# Patient Record
Sex: Female | Born: 1970 | Race: Black or African American | Hispanic: No | Marital: Married | Smoking: Current every day smoker
Health system: Southern US, Community
[De-identification: ages and names within clinical notes are randomized; demographics above are authoritative.]

## PROBLEM LIST (undated history)

## (undated) HISTORY — PX: FRACTURE SURGERY: SHX138

---

## 2007-02-11 ENCOUNTER — Emergency Department: Payer: Self-pay | Admitting: Emergency Medicine

## 2010-10-15 ENCOUNTER — Emergency Department: Payer: Self-pay | Admitting: Emergency Medicine

## 2011-11-20 ENCOUNTER — Ambulatory Visit: Payer: Self-pay | Admitting: Family Medicine

## 2011-12-27 ENCOUNTER — Emergency Department: Payer: Self-pay | Admitting: Internal Medicine

## 2011-12-27 LAB — URINALYSIS, COMPLETE
Glucose,UR: NEGATIVE mg/dL (ref 0–75)
Ketone: NEGATIVE
Nitrite: NEGATIVE
WBC UR: 82 /HPF (ref 0–5)

## 2011-12-27 LAB — COMPREHENSIVE METABOLIC PANEL
Anion Gap: 11 (ref 7–16)
Calcium, Total: 8.3 mg/dL — ABNORMAL LOW (ref 8.5–10.1)
Chloride: 109 mmol/L — ABNORMAL HIGH (ref 98–107)
Co2: 23 mmol/L (ref 21–32)
Creatinine: 0.7 mg/dL (ref 0.60–1.30)
EGFR (African American): >60
EGFR (Non-African Amer.): >60
Glucose: 91 mg/dL (ref 65–99)
Osmolality: 283 (ref 275–301)
Potassium: 3.8 mmol/L (ref 3.5–5.1)
SGOT(AST): 17 U/L (ref 15–37)
SGPT (ALT): 18 U/L

## 2011-12-27 LAB — LIPASE, BLOOD: Lipase: 135 U/L (ref 73–393)

## 2011-12-27 LAB — CBC
HGB: 12.8 g/dL (ref 12.0–16.0)
Platelet: 278 10*3/uL (ref 150–440)
RBC: 4.26 10*6/uL (ref 3.80–5.20)
RDW: 12.7 % (ref 11.5–14.5)

## 2011-12-27 LAB — PREGNANCY, URINE: Pregnancy Test, Urine: NEGATIVE m[IU]/mL

## 2012-01-18 ENCOUNTER — Ambulatory Visit: Payer: Self-pay | Admitting: Family Medicine

## 2012-02-22 ENCOUNTER — Ambulatory Visit: Payer: Self-pay | Admitting: Urology

## 2012-06-03 ENCOUNTER — Ambulatory Visit: Payer: Self-pay | Admitting: Orthopaedic Surgery

## 2012-06-03 ENCOUNTER — Inpatient Hospital Stay: Payer: Self-pay | Admitting: Specialist

## 2012-06-04 LAB — BASIC METABOLIC PANEL
Anion Gap: 7 (ref 7–16)
BUN: 8 mg/dL (ref 7–18)
Calcium, Total: 8.7 mg/dL (ref 8.5–10.1)
Osmolality: 279 (ref 275–301)
Sodium: 140 mmol/L (ref 136–145)

## 2012-06-04 LAB — PREGNANCY, URINE: Pregnancy Test, Urine: NEGATIVE m[IU]/mL

## 2012-06-04 LAB — CBC WITH DIFFERENTIAL/PLATELET
Eosinophil #: 0 10*3/uL (ref 0.0–0.7)
Eosinophil %: 0.1 %
HGB: 11.5 g/dL — ABNORMAL LOW (ref 12.0–16.0)
Lymphocyte #: 2.2 10*3/uL (ref 1.0–3.6)
MCH: 30.3 pg (ref 26.0–34.0)
MCV: 92 fL (ref 80–100)
Monocyte #: 0.6 x10 3/mm (ref 0.2–0.9)
Monocyte %: 4.7 %
Neutrophil %: 76.5 %
Platelet: 296 10*3/uL (ref 150–440)

## 2012-10-04 ENCOUNTER — Ambulatory Visit: Payer: Self-pay | Admitting: Family Medicine

## 2013-03-02 ENCOUNTER — Emergency Department: Payer: Self-pay | Admitting: Emergency Medicine

## 2015-04-07 NOTE — H&P (Signed)
    Subjective/Chief Complaint Right ankle and Right wrist pain after fall    History of Present Illness skating today and she experienced a fall.  immediate pain and deformity of R ankle and pain in R wrist.  no blow to the head and no LOC.  presented to ED for evaluation and ortho consulted for injuries listed above.    Past History urethral diverticulosis   Past Med/Surgical Hx:  Diverticulitis:   c-section x 4:   ALLERGIES:  No Known Allergies:   Family and Social History:   Family History Non-Contributory    Social History positive  tobacco, negative ETOH    + Tobacco Current (within 1 year)    Place of Living Home   Review of Systems:   Fever/Chills No    Cough No    Sputum No    Abdominal Pain No    Diarrhea No    Constipation No    Nausea/Vomiting No    SOB/DOE No    Chest Pain No    Dysuria No    Tolerating Diet Yes    Medications/Allergies Reviewed Medications/Allergies reviewed   Physical Exam:   GEN no acute distress    HEENT hearing intact to voice    NECK supple    RESP normal resp effort    CARD regular rate    ABD denies tenderness    LYMPH negaitve RLE    EXTR RLE with ext rotation deformity at the distal tibia, EHL/FHL intact, unable to asses TA and GS due to pain, sens intact throughout, 2+ dp; RUE tender radial aspect distal radius proximal to carpus, AIN/PIN/ulnar n intact, sens intact radial, ulnar, median dist, 2+ radial, warm and perfused    SKIN normal to palpation    NEURO follows commands    PSYCH A+O to time, place, person     Assessment/Admission Diagnosis R wrist XR - no noted displaced fx or malalignment.   R tib/fib XR - spiral fracture of distal tibia and comminuted distal fibula fx    Plan R wrist -  awaiting final radiology read  R ankle -  NWB I applied Short leg splint in the ED CT scan pending to rule out articular involvement of spiral tibial fracture discussed surgical plan with the patient and  her husband, ORIF with IM nail and plate/screws tomorrow NPO p MN pain control elevate RLE   Electronic Signatures: Otilio SaberSikes, Aurorah Schlachter V (MD)  (Signed 21-Jun-13 23:19)  Authored: CHIEF COMPLAINT and HISTORY, PAST MEDICAL/SURGIAL HISTORY, ALLERGIES, FAMILY AND SOCIAL HISTORY, REVIEW OF SYSTEMS, PHYSICAL EXAM, ASSESSMENT AND PLAN   Last Updated: 21-Jun-13 23:19 by Otilio SaberSikes, Jazmen Lindenbaum V (MD)

## 2015-04-07 NOTE — Discharge Summary (Signed)
PATIENT NAME:  Deborah PealsHOMAS, Rasa S MR#:  [id number] DATE OF BIRTH:  [birth year]  DATE OF ADMISSION:  06/03/2012 DATE OF DISCHARGE:  06/04/2012  DISCHARGE DIAGNOSIS: Comminuted right tibial shaft fracture with distal fibula fracture.   OPERATIONS/PROCEDURES PERFORMED: Intramedullary rodding right distal tibia and open reduction and internal fixation distal fibula fracture.   HOSPITAL COURSE: The patient was admitted by Dr. Crist Infanteharles Sikes and treated by him. History and physical examination is as produced by him on admission. The patient was taken to the Operating Room on 06/04/2012 where the above procedure was performed. She tolerated the procedure quite well. She was advanced up into the chair with physical therapy and ambulation nonweightbearing with a walker. She was discharged doing well to her home. She was to resume taking her previous medications at home except for naproxen and is to take aspirin 325 mg per day as prophylaxis for venous thrombosis and pulmonary embolism and also Vicodin 1 tablet every three hours as necessary for pain. She was advised to be completely nonweightbearing and is to keep her splint and dressing dry. She is to return to the office in one week to see myself, Dr. Katrinka BlazingSmith, for possible staple removal and x-ray.  ____________________________ Clare Gandyhristopher E. Merrit Friesen, MD ces:ap D: 06/23/2012 07:33:48 ET T: 06/23/2012 08:03:09 ET JOB#: [id number]  cc: Clare Gandyhristopher E. Zahara Rembert, MD, <Dictator> Clare GandyHRISTOPHER E Filip Luten MD ELECTRONICALLY SIGNED 06/24/2012 9:04

## 2015-04-07 NOTE — Op Note (Signed)
PATIENT NAME:  Deborah Lawrence, Deborah Lawrence MR#:  [id number] DATE OF BIRTH:  09-28-1971  DATE OF PROCEDURE:  06/04/2012  PREOPERATIVE DIAGNOSIS: Comminuted right distal third tibia fracture with associated posterior malleolar fragment and comminuted lateral malleolus/distal fibula fracture.   PROCEDURE: Open reduction internal fixation right distal tibia and distal fibular fractures.   SURGEON: Collier Bullock. Jasaiah Karwowski, MD  ANESTHESIA: General.   ESTIMATED BLOOD LOSS: 200 mL.  TOURNIQUET TIME: 60 minutes.   SPECIMENS: None.   DRAINS: None.   OPERATIVE FINDING: Comminuted tib-fib fracture as listed above.   IMPLANTS:  1. Synthes size 9 tibial female.  2. Synthes 4.0 cannulated screw for the posterior malleolar fragment.  3. Synthes one-third semitubular plate and screws for the distal fibula.   COMPLICATIONS: None.   INDICATIONS FOR THE PROCEDURE: The patient is a 44 year old female who on the day prior to this procedure presented to the Emergency Department after a mechanical fall at roller skating rink where she sustained a twisting injury to her right ankle. At that time patient was found to have the above-mentioned injuries and was admitted to the hospital for pain control and preoperative planning. Her fractures were reduced and placed into a well-padded splint. CT scan was obtained and demonstrated the associated posterior malleolus fragment that was nondisplaced at that time. The patient and her family were advised of the risks, benefits, and alternatives of operative versus nonoperative management and elected to proceed with operative management. They expressed understanding of the risks of surgery included but not limited to risk of bleeding, infection, damage to nerves, vessels, and other structures, need for further surgeries in the future, possible hardware failure, possible delayed union or malunion of the fracture fragments and possible deep venous thrombosis or pulmonary embolus. Informed consent  was obtained prior to the procedure.   DESCRIPTION OF PROCEDURE IN DETAIL: The patient was brought to the Operating Room, placed supine on the OR table. After general anesthesia was induced without complication the right lower extremity was then prepped and draped in a normal sterile fashion. Timeout was conducted where the patient was identified as the correct patient, allergies were reviewed and the right side was identified as the correct side and agreement was made to give preoperative antibiotics including Kefzol. Attention was first directed to placement of guidewire for 4.0 cannulated screw in the AP direction to stabilize the nondisplaced posterior malleolar fragment of the distal tibia. Guidewire was placed under fluoroscopic guidance, measured and overdrilled. An appropriate length screw was selected and placed over the guidewire. Fluoroscopic views demonstrated that this suitably maintained position of the nondisplaced posterior malleolar section. Attention was then turned to preparation for insertion of the tibial nail. Standard midline anterior knee approach and transpatellar tendon approach was used to gain access to the anterior aspect of the knee. Guidepin was placed under fluoroscopic guidance. After placement and direction were confirmed with AP and fluoroscopic views the entry reamer was used to open the tibial canal. Guidewire was then placed across the fracture and impacted into the distal physial scar in the appropriate position. Pressure was maintained in a reduced position while subsequent reamers were introduced to ream the course of the tibial nail starting with an 8.5 end-cutting reamer and progressing up to a 10.5 mm reamer. 10.5 mm reamer appeared to give good chatter in the isthmus and would give good fit and fill of her canal therefore size 9 nail was chosen to allow for ease of insertion and hoping to avoid displacement of the fracture  fragments during insertion. Size 9 Synthes  tibia EX nail was then inserted without complication. Nail was impacted and periodic fluoroscopic images were checked to assure appropriate fracture alignment during placement of the nail. Once the nail had been fully seated lateral view of the knee was taken to assure that the nail would not be prominent at the anterior knee. Once placement of the nail was complete the proximal interlocks were then introduced through the proximal tibial jig. Alignment of the fracture fragments was confirmed. The fracture was impacted slightly and the distal interlocks were inserted using perfect circle technique. Three distal interlocks were inserted due to the distal nature and comminuted nature of the overall fracture pattern, two medial to lateral screws and one anterior to posterior screw. The oblique screws at the most distal portion of the nail were not used as it was felt that the prior placed 4.0 cannulated screw may further interfere with the trajectory of the oblique screws. Attention was then directed to the distal fibula. Skin was incised sharply and blunt dissection taken down to the level of the fibula laterally. Proximal and distal aspects of the fibula were exposed. A 10-hole one-third semitubular plate was chosen as the appropriate length and distal portion of the plate was contoured to fit the distal fibula. The plate was first affixed to the distal fibula. Fibula was checked to be in the appropriate length and orientation in relation to the distal tibia and the talus. Once this had been achieved plate was affixed proximally to the intact fibula spanning the severely comminuted gap. AP and lateral fluoroscopic views demonstrated good overall fracture alignment and plate alignment on the bone therefore the remainder of the screws were drilled and filled with a mixture of 4.0 cancellus screws and 3.5 cortical screws as appropriate for the bone quality in those respective areas. All wounds were thoroughly irrigated  with normal saline and closed in layered fashion using 0 Vicryl for the peritenon at the anterior knee incision and 2-0 Vicryl for the subcutaneous layer of all other incisions and staples for the skin on all other incisions. Dry sterile dressings were applied and the patient was placed into a well padded posterior and mediolateral splint. The patient was awakened from her anesthesia and transferred to her stretcher and to the PACU in stable condition.   DISPOSITION: Patient will be nonweightbearing to the right lower extremity. She will keep it elevated to reduce swelling and pain. She will have deep vein thrombosis prophylaxis with aspirin 325 mg b.i.d. as she would be expected to be rather mobile and ambulatory in the early postoperative period despite her non-wight bearing restriction. We feel that she is low risk for deep vein thrombosis or PE given her overall situation. She will continue her prophylactic antibiotics with Kefzol. She will have early mobilization with physical therapy and occupational therapy. Patient will plan to follow up with Dr. Myra Rudehristopher Smith in clinic for further evaluation, treatment and recommendation regarding progression of weight-bearing in approximately six weeks. Patient has also been counseled extensively and discussions with her family have centered around the need for her to stop smoking in attempt to further encourage fracture healing following this injury. At the end of the case all sponge counts and instrument counts were correct.   ____________________________ Collier Bullockharles V. Davelyn Gwinn, MD [facility] D: 06/04/2012 18:06:34 ET T: 06/05/2012 10:51:12 ET JOB#: [id number]  cc: Collier Bullockharles V. Bibi Economos, MD, <Dictator> Otilio SaberHARLES V Valisa Karpel MD ELECTRONICALLY SIGNED 07/11/2012 8:36

## 2015-11-15 ENCOUNTER — Encounter: Payer: Self-pay | Admitting: Physician Assistant

## 2015-11-15 ENCOUNTER — Ambulatory Visit: Payer: Self-pay | Admitting: Physician Assistant

## 2015-11-15 VITALS — BP 120/70 | HR 84 | Temp 99.2°F

## 2015-11-15 DIAGNOSIS — B349 Viral infection, unspecified: Secondary | ICD-10-CM

## 2015-11-15 DIAGNOSIS — R197 Diarrhea, unspecified: Secondary | ICD-10-CM

## 2015-11-15 NOTE — Progress Notes (Signed)
S: c/o body aches, fever, chills, cough, diarrhea x 7 today, sx started yesterday, denies bp/sob  O: vitals with temp at 99.2, nad, ENT wnl, neck supple no lymph, lungs c t a, cv rrr, abd soft nontender bs normal all 4 quads,  Flu swab neg  A: diarrhea, viral illness  P: otc  Meds, fluids, rest, otc immodium ad, pt should not work until has not had a fever in 24-48h and has not had diarrhea in 24-48 h

## 2015-11-15 NOTE — Patient Instructions (Signed)
Food Choices to Help Relieve Diarrhea, Adult When you have diarrhea, the foods you eat and your eating habits are very important. Choosing the right foods and drinks can help relieve diarrhea. Also, because diarrhea can last up to 7 days, you need to replace lost fluids and electrolytes (such as sodium, potassium, and chloride) in order to help prevent dehydration.  WHAT GENERAL GUIDELINES DO I NEED TO FOLLOW?  Slowly drink 1 cup (8 oz) of fluid for each episode of diarrhea. If you are getting enough fluid, your urine will be clear or pale yellow.  Eat starchy foods. Some good choices include white rice, white toast, pasta, low-fiber cereal, baked potatoes (without the skin), saltine crackers, and bagels.  Avoid large servings of any cooked vegetables.  Limit fruit to two servings per day. A serving is  cup or 1 small piece.  Choose foods with less than 2 g of fiber per serving.  Limit fats to less than 8 tsp (38 g) per day.  Avoid fried foods.  Eat foods that have probiotics in them. Probiotics can be found in certain dairy products.  Avoid foods and beverages that may increase the speed at which food moves through the stomach and intestines (gastrointestinal tract). Things to avoid include:  High-fiber foods, such as dried fruit, raw fruits and vegetables, nuts, seeds, and whole grain foods.  Spicy foods and high-fat foods.  Foods and beverages sweetened with high-fructose corn syrup, honey, or sugar alcohols such as xylitol, sorbitol, and mannitol. WHAT FOODS ARE RECOMMENDED? Grains White rice. White, French, or pita breads (fresh or toasted), including plain rolls, buns, or bagels. White pasta. Saltine, soda, or graham crackers. Pretzels. Low-fiber cereal. Cooked cereals made with water (such as cornmeal, farina, or cream cereals). Plain muffins. Matzo. Melba toast. Zwieback.  Vegetables Potatoes (without the skin). Strained tomato and vegetable juices. Most well-cooked and canned  vegetables without seeds. Tender lettuce. Fruits Cooked or canned applesauce, apricots, cherries, fruit cocktail, grapefruit, peaches, pears, or plums. Fresh bananas, apples without skin, cherries, grapes, cantaloupe, grapefruit, peaches, oranges, or plums.  Meat and Other Protein Products Baked or boiled chicken. Eggs. Tofu. Fish. Seafood. Smooth peanut butter. Ground or well-cooked tender beef, ham, veal, lamb, pork, or poultry.  Dairy Plain yogurt, kefir, and unsweetened liquid yogurt. Lactose-free milk, buttermilk, or soy milk. Plain hard cheese. Beverages Sport drinks. Clear broths. Diluted fruit juices (except prune). Regular, caffeine-free sodas such as ginger ale. Water. Decaffeinated teas. Oral rehydration solutions. Sugar-free beverages not sweetened with sugar alcohols. Other Bouillon, broth, or soups made from recommended foods.  The items listed above may not be a complete list of recommended foods or beverages. Contact your dietitian for more options. WHAT FOODS ARE NOT RECOMMENDED? Grains Whole grain, whole wheat, bran, or rye breads, rolls, pastas, crackers, and cereals. Wild or brown rice. Cereals that contain more than 2 g of fiber per serving. Corn tortillas or taco shells. Cooked or dry oatmeal. Granola. Popcorn. Vegetables Raw vegetables. Cabbage, broccoli, Brussels sprouts, artichokes, baked beans, beet greens, corn, kale, legumes, peas, sweet potatoes, and yams. Potato skins. Cooked spinach and cabbage. Fruits Dried fruit, including raisins and dates. Raw fruits. Stewed or dried prunes. Fresh apples with skin, apricots, mangoes, pears, raspberries, and strawberries.  Meat and Other Protein Products Chunky peanut butter. Nuts and seeds. Beans and lentils. Bacon.  Dairy High-fat cheeses. Milk, chocolate milk, and beverages made with milk, such as milk shakes. Cream. Ice cream. Sweets and Desserts Sweet rolls, doughnuts, and sweet breads.   Pancakes and waffles. Fats and  Oils Butter. Cream sauces. Margarine. Salad oils. Plain salad dressings. Olives. Avocados.  Beverages Caffeinated beverages (such as coffee, tea, soda, or energy drinks). Alcoholic beverages. Fruit juices with pulp. Prune juice. Soft drinks sweetened with high-fructose corn syrup or sugar alcohols. Other Coconut. Hot sauce. Chili powder. Mayonnaise. Gravy. Cream-based or milk-based soups.  The items listed above may not be a complete list of foods and beverages to avoid. Contact your dietitian for more information. WHAT SHOULD I DO IF I BECOME DEHYDRATED? Diarrhea can sometimes lead to dehydration. Signs of dehydration include dark urine and dry mouth and skin. If you think you are dehydrated, you should rehydrate with an oral rehydration solution. These solutions can be purchased at pharmacies, retail stores, or online.  Drink -1 cup (120-240 mL) of oral rehydration solution each time you have an episode of diarrhea. If drinking this amount makes your diarrhea worse, try drinking smaller amounts more often. For example, drink 1-3 tsp (5-15 mL) every 5-10 minutes.  A general rule for staying hydrated is to drink 1-2 L of fluid per day. Talk to your health care provider about the specific amount you should be drinking each day. Drink enough fluids to keep your urine clear or pale yellow.   This information is not intended to replace advice given to you by your health care provider. Make sure you discuss any questions you have with your health care provider.   Document Released: 02/20/2004 Document Revised: 12/21/2014 Document Reviewed: 10/23/2013 Elsevier Interactive Patient Education 2016 Else[employer] Inc. Viral Gastroenteritis Viral gastroenteritis is also called stomach flu. This illness is caused by a certain type of germ (virus). It can cause sudden watery poop (diarrhea) and throwing up (vomiting). This can cause you to lose body fluids (dehydration). This illness usually lasts for 3 to 8 days.  It usually goes away on its own. HOME CARE   Drink enough fluids to keep your pee (urine) clear or pale yellow. Drink small amounts of fluids often.  Ask your doctor how to replace body fluid losses (rehydration).  Avoid:  Foods high in sugar.  Alcohol.  Bubbly (carbonated) drinks.  Tobacco.  Juice.  Caffeine drinks.  Very hot or cold fluids.  Fatty, greasy foods.  Eating too much at one time.  Dairy products until 24 to 48 hours after your watery poop stops.  You may eat foods with active cultures (probiotics). They can be found in some yogurts and supplements.  Wash your hands well to avoid spreading the illness.  Only take medicines as told by your doctor. Do not give aspirin to children. Do not take medicines for watery poop (antidiarrheals).  Ask your doctor if you should keep taking your regular medicines.  Keep all doctor visits as told. GET HELP RIGHT AWAY IF:   You cannot keep fluids down.  You do not pee at least once every 6 to 8 hours.  You are short of breath.  You see blood in your poop or throw up. This may look like coffee grounds.  You have belly (abdominal) pain that gets worse or is just in one small spot (localized).  You keep throwing up or having watery poop.  You have a fever.  The patient is a child younger than 3 months, and he or she has a fever.  The patient is a child older than 3 months, and he or she has a fever and problems that do not go away.  The patient is  a child older than 3 months, and he or she has a fever and problems that suddenly get worse.  The patient is a baby, and he or she has no tears when crying. MAKE SURE YOU:   Understand these instructions.  Will watch your condition.  Will get help right away if you are not doing well or get worse.   This information is not intended to replace advice given to you by your health care provider. Make sure you discuss any questions you have with your health care  provider.   Document Released: 05/18/2008 Document Revised: 02/22/2012 Document Reviewed: 09/16/2011 Elsevier Interactive Patient Education 2016 Else[employer] Inc. Viral Infections A virus is a type of germ. Viruses can cause:  Minor sore throats.  Aches and pains.  Headaches.  Runny nose.  Rashes.  Watery eyes.  Tiredness.  Coughs.  Loss of appetite.  Feeling sick to your stomach (nausea).  Throwing up (vomiting).  Watery poop (diarrhea). HOME CARE   Only take medicines as told by your doctor.  Drink enough water and fluids to keep your pee (urine) clear or pale yellow. Sports drinks are a good choice.  Get plenty of rest and eat healthy. Soups and broths with crackers or rice are fine. GET HELP RIGHT AWAY IF:   You have a very bad headache.  You have shortness of breath.  You have chest pain or neck pain.  You have an unusual rash.  You cannot stop throwing up.  You have watery poop that does not stop.  You cannot keep fluids down.  You or your child has a temperature by mouth above 102 F (38.9 C), not controlled by medicine.  Your baby is older than 3 months with a rectal temperature of 102 F (38.9 C) or higher.  Your baby is 823 months old or younger with a rectal temperature of 100.4 F (38 C) or higher. MAKE SURE YOU:   Understand these instructions.  Will watch this condition.  Will get help right away if you are not doing well or get worse.   This information is not intended to replace advice given to you by your health care provider. Make sure you discuss any questions you have with your health care provider.   Document Released: 11/12/2008 Document Revised: 02/22/2012 Document Reviewed: 05/08/2015 Elsevier Interactive Patient Education [employer]

## 2015-11-15 NOTE — Addendum Note (Signed)
Addended by: Yvonne KendallBROWN, SANDRA D on: 11/15/2015 02:35 PM   Modules accepted: Orders

## 2015-12-11 ENCOUNTER — Emergency Department
Admission: EM | Admit: 2015-12-11 | Discharge: 2015-12-11 | Disposition: A | Discharge status: Home / Self Care | Payer: PRIVATE HEALTH INSURANCE | Attending: Emergency Medicine | Admitting: Emergency Medicine

## 2015-12-11 ENCOUNTER — Encounter: Payer: Self-pay | Admitting: Emergency Medicine

## 2015-12-11 DIAGNOSIS — Y990 Civilian activity done for income or pay: Secondary | ICD-10-CM | POA: Diagnosis not present

## 2015-12-11 DIAGNOSIS — X500XXA Overexertion from strenuous movement or load, initial encounter: Secondary | ICD-10-CM | POA: Insufficient documentation

## 2015-12-11 DIAGNOSIS — S39012A Strain of muscle, fascia and tendon of lower back, initial encounter: Secondary | ICD-10-CM | POA: Insufficient documentation

## 2015-12-11 DIAGNOSIS — Y9289 Other specified places as the place of occurrence of the external cause: Secondary | ICD-10-CM | POA: Insufficient documentation

## 2015-12-11 DIAGNOSIS — S3992XA Unspecified injury of lower back, initial encounter: Secondary | ICD-10-CM | POA: Diagnosis present

## 2015-12-11 DIAGNOSIS — F17200 Nicotine dependence, unspecified, uncomplicated: Secondary | ICD-10-CM | POA: Diagnosis not present

## 2015-12-11 DIAGNOSIS — Y9389 Activity, other specified: Secondary | ICD-10-CM | POA: Insufficient documentation

## 2015-12-11 DIAGNOSIS — M5416 Radiculopathy, lumbar region: Secondary | ICD-10-CM | POA: Diagnosis not present

## 2015-12-11 DIAGNOSIS — M62830 Muscle spasm of back: Secondary | ICD-10-CM

## 2015-12-11 MED ORDER — MELOXICAM 15 MG PO TABS
15.0000 mg | ORAL_TABLET | Freq: Every day | ORAL | Status: AC
Start: 2015-12-11 — End: ?

## 2015-12-11 MED ORDER — CYCLOBENZAPRINE HCL 10 MG PO TABS: 10.0000 mg | ORAL_TABLET | Freq: Three times a day (TID) | ORAL | Status: AC | PRN

## 2015-12-11 NOTE — ED Notes (Signed)
Pt states she hurt her back at work last week. C/o low back pain.

## 2015-12-11 NOTE — Discharge Instructions (Signed)
Back Exercises The following exercises strengthen the muscles that help to support the back. They also help to keep the lower back flexible. Doing these exercises can help to prevent back pain or lessen existing pain. If you have back pain or discomfort, try doing these exercises 2-3 times each day or as told by your health care provider. When the pain goes away, do them once each day, but increase the number of times that you repeat the steps for each exercise (do more repetitions). If you do not have back pain or discomfort, do these exercises once each day or as told by your health care provider. EXERCISES Single Knee to Chest Repeat these steps 3-5 times for each leg:  Lie on your back on a firm bed or the floor with your legs extended.  Bring one knee to your chest. Your other leg should stay extended and in contact with the floor.  Hold your knee in place by grabbing your knee or thigh.  Pull on your knee until you feel a gentle stretch in your lower back.  Hold the stretch for 10-30 seconds.  Slowly release and straighten your leg. Pelvic Tilt Repeat these steps 5-10 times:  Lie on your back on a firm bed or the floor with your legs extended.  Bend your knees so they are pointing toward the ceiling and your feet are flat on the floor.  Tighten your lower abdominal muscles to press your lower back against the floor. This motion will tilt your pelvis so your tailbone points up toward the ceiling instead of pointing to your feet or the floor.  With gentle tension and even breathing, hold this position for 5-10 seconds. Cat-Cow Repeat these steps until your lower back becomes more flexible:  Get into a hands-and-knees position on a firm surface. Keep your hands under your shoulders, and keep your knees under your hips. You may place padding under your knees for comfort.  Let your head hang down, and point your tailbone toward the floor so your lower back becomes rounded like the  back of a cat.  Hold this position for 5 seconds.  Slowly lift your head and point your tailbone up toward the ceiling so your back forms a sagging arch like the back of a cow.  Hold this position for 5 seconds. Press-Ups Repeat these steps 5-10 times:  Lie on your abdomen (face-down) on the floor.  Place your palms near your head, about shoulder-width apart.  While you keep your back as relaxed as possible and keep your hips on the floor, slowly straighten your arms to raise the top half of your body and lift your shoulders. Do not use your back muscles to raise your upper torso. You may adjust the placement of your hands to make yourself more comfortable.  Hold this position for 5 seconds while you keep your back relaxed.  Slowly return to lying flat on the floor. Bridges Repeat these steps 10 times:  Lie on your back on a firm surface.  Bend your knees so they are pointing toward the ceiling and your feet are flat on the floor.  Tighten your buttocks muscles and lift your buttocks off of the floor until your waist is at almost the same height as your knees. You should feel the muscles working in your buttocks and the back of your thighs. If you do not feel these muscles, slide your feet 1-2 inches farther away from your buttocks.  Hold this position for 3-5  seconds.  Slowly lower your hips to the starting position, and allow your buttocks muscles to relax completely. If this exercise is too easy, try doing it with your arms crossed over your chest. Abdominal Crunches Repeat these steps 5-10 times:  Lie on your back on a firm bed or the floor with your legs extended.  Bend your knees so they are pointing toward the ceiling and your feet are flat on the floor.  Cross your arms over your chest.  Tip your chin slightly toward your chest without bending your neck.  Tighten your abdominal muscles and slowly raise your trunk (torso) high enough to lift your shoulder blades a  tiny bit off of the floor. Avoid raising your torso higher than that, because it can put too much stress on your low back and it does not help to strengthen your abdominal muscles.  Slowly return to your starting position. Back Lifts Repeat these steps 5-10 times: 1. Lie on your abdomen (face-down) with your arms at your sides, and rest your forehead on the floor. 2. Tighten the muscles in your legs and your buttocks. 3. Slowly lift your chest off of the floor while you keep your hips pressed to the floor. Keep the back of your head in line with the curve in your back. Your eyes should be looking at the floor. 4. Hold this position for 3-5 seconds. 5. Slowly return to your starting position. SEEK MEDICAL CARE IF:  Your back pain or discomfort gets much worse when you do an exercise.  Your back pain or discomfort does not lessen within 2 hours after you exercise. If you have any of these problems, stop doing these exercises right away. Do not do them again unless your health care provider says that you can. SEEK IMMEDIATE MEDICAL CARE IF:  You develop sudden, severe back pain. If this happens, stop doing the exercises right away. Do not do them again unless your health care provider says that you can.   This information is not intended to replace advice given to you by your health care provider. Make sure you discuss any questions you have with your health care provider.   Document Released: 01/07/2005 Document Revised: 08/21/2015 Document Reviewed: 01/24/2015 Elsevier Interactive Patient Education 2016 [place] Injury Prevention Back injuries can be very painful. They can also be difficult to heal. After having one back injury, you are more likely to injure your back again. It is important to learn how to avoid injuring or re-injuring your back. The following tips can help you to prevent a back injury. WHAT SHOULD I KNOW ABOUT PHYSICAL FITNESS?  Exercise for 30 minutes per  day on most days of the week or as directed by your health care provider. Make sure to:  Do aerobic exercises, such as walking, jogging, biking, or swimming.  Do exercises that increase balance and strength, such as tai chi and yoga. These can decrease your risk of falling and injuring your back.  Do stretching exercises to help with flexibility.  Try to develop strong abdominal muscles. Your abdominal muscles provide a lot of the support that is needed by your back.  Maintain a healthy weight. This helps to decrease your risk of a back injury. WHAT SHOULD I KNOW ABOUT MY DIET?  Talk with your health care provider about your overall diet. Take supplements and vitamins only as directed by your health care provider.  Talk with your health care provider about how much calcium and  vitamin D you need each day. These nutrients help to prevent weakening of the bones (osteoporosis). Osteoporosis can cause broken (fractured) bones, which lead to back pain. °· Include good sources of calcium in your diet, such as dairy products, green leafy vegetables, and products that have had calcium added to them (fortified). °· Include good sources of vitamin D in your diet, such as milk and foods that are fortified with vitamin D. °WHAT SHOULD I KNOW ABOUT MY POSTURE? °· Sit up straight and stand up straight. Avoid leaning forward when you sit or hunching over when you stand. °· Choose chairs that have good low-back (lumbar) support. °· If you work at a desk, sit close to it so you do not need to lean over. Keep your chin tucked in. Keep your neck drawn back, and keep your elbows bent at a right angle. Your arms should look like the letter "L." °· Sit high and close to the steering wheel when you drive. Add a lumbar support to your car seat, if needed. °· Avoid sitting or standing in one position for very long. Take breaks to get up, stretch, and walk around at least one time every hour. Take breaks every hour if you are  driving for long periods of time. °· Sleep on your side with your knees slightly bent, or sleep on your back with a pillow under your knees. Do not lie on the front of your body to sleep. °WHAT SHOULD I KNOW ABOUT LIFTING, TWISTING, AND REACHING? °Lifting and Heavy Lifting °· Avoid heavy lifting, especially repetitive heavy lifting. If you must do heavy lifting: °· Stretch before lifting. °· Work slowly. °· Rest between lifts. °· Use a tool such as a cart or a dolly to move objects if one is available. °· Make several small trips instead of carrying one heavy load. °· Ask for help when you need it, especially when moving big objects. °· Follow these steps when lifting: °· Stand with your feet shoulder-width apart. °· Get as close to the object as you can. Do not try to pick up a heavy object that is far from your body. °· Use handles or lifting straps if they are available. °· Bend at your knees. Squat down, but keep your heels off the floor. °· Keep your shoulders pulled back, your chin tucked in, and your back straight. °· Lift the object slowly while you tighten the muscles in your legs, abdomen, and buttocks. Keep the object as close to the center of your body as possible. °· Follow these steps when putting down a heavy load: °· Stand with your feet shoulder-width apart. °· Lower the object slowly while you tighten the muscles in your legs, abdomen, and buttocks. Keep the object as close to the center of your body as possible. °· Keep your shoulders pulled back, your chin tucked in, and your back straight. °· Bend at your knees. Squat down, but keep your heels off the floor. °· Use handles or lifting straps if they are available. °Twisting and Reaching °· Avoid lifting heavy objects above your waist. °· Do not twist at your waist while you are lifting or carrying a load. If you need to turn, move your feet. °· Do not bend over without bending at your knees. °· Avoid reaching over your head, across a table, or  for an object on a high surface. °WHAT ARE SOME OTHER TIPS? °· Avoid wet floors and icy ground. Keep sidewalks clear of ice to prevent   falls.  Do not sleep on a mattress that is too soft or too hard.  Keep items that are used frequently within easy reach.  Put heavier objects on shelves at waist level, and put lighter objects on lower or higher shelves.  Find ways to decrease your stress, such as exercise, massage, or relaxation techniques. Stress can build up in your muscles. Tense muscles are more vulnerable to injury.  Talk with your health care provider if you feel anxious or depressed. These conditions can make back pain worse.  Wear flat heel shoes with cushioned soles.  Avoid sudden movements.  Use both shoulder straps when carrying a backpack.  Do not use any tobacco products, including cigarettes, chewing tobacco, or electronic cigarettes. If you need help quitting, ask your health care provider.   This information is not intended to replace advice given to you by your health care provider. Make sure you discuss any questions you have with your health care provider.   Document Released: 01/07/2005 Document Revised: 04/16/2015 Document Reviewed: 12/04/2014 Elsevier Interactive Patient Education 2016 [place] therapy can help ease sore, stiff, injured, and tight muscles and joints. Heat relaxes your muscles, which may help ease your pain.  RISKS AND COMPLICATIONS If you have any of the following conditions, do not use heat therapy unless your health care provider has approved:  Poor circulation.  Healing wounds or scarred skin in the area being treated.  Diabetes, heart disease, or high blood pressure.  Not being able to feel (numbness) the area being treated.  Unusual swelling of the area being treated.  Active infections.  Blood clots.  Cancer.  Inability to communicate pain. This may include young children and people who have problems  with their brain function (dementia).  Pregnancy. Heat therapy should only be used on old, pre-existing, or long-lasting (chronic) injuries. Do not use heat therapy on new injuries unless directed by your health care provider. HOW TO USE HEAT THERAPY There are several different kinds of heat therapy, including:  Moist heat pack.  Warm water bath.  Hot water bottle.  Electric heating pad.  Heated gel pack.  Heated wrap.  Electric heating pad. Use the heat therapy method suggested by your health care provider. Follow your health care provider's instructions on when and how to use heat therapy. GENERAL HEAT THERAPY RECOMMENDATIONS  Do not sleep while using heat therapy. Only use heat therapy while you are awake.  Your skin may turn pink while using heat therapy. Do not use heat therapy if your skin turns red.  Do not use heat therapy if you have new pain.  High heat or long exposure to heat can cause burns. Be careful when using heat therapy to avoid burning your skin.  Do not use heat therapy on areas of your skin that are already irritated, such as with a rash or sunburn. SEEK MEDICAL CARE IF:  You have blisters, redness, swelling, or numbness.  You have new pain.  Your pain is worse. MAKE SURE YOU:  Understand these instructions.  Will watch your condition.  Will get help right away if you are not doing well or get worse.   This information is not intended to replace advice given to you by your health care provider. Make sure you discuss any questions you have with your health care provider.   Document Released: 02/22/2012 Document Revised: 12/21/2014 Document Reviewed: 01/23/2014 Elsevier Interactive Patient Education 2016 [employer]  Lumbosacral Strain Lumbosacral strain is  a strain of any of the parts that make up your lumbosacral vertebrae. Your lumbosacral vertebrae are the bones that make up the lower third of your backbone. Your lumbosacral vertebrae  are held together by muscles and tough, fibrous tissue (ligaments).  CAUSES  A sudden blow to your back can cause lumbosacral strain. Also, anything that causes an excessive stretch of the muscles in the low back can cause this strain. This is typically seen when people exert themselves strenuously, fall, lift heavy objects, bend, or crouch repeatedly. RISK FACTORS  Physically demanding work.  Participation in pushing or pulling sports or sports that require a sudden twist of the back (tennis, golf, baseball).  Weight lifting.  Excessive lower back curvature.  Forward-tilted pelvis.  Weak back or abdominal muscles or both.  Tight hamstrings. SIGNS AND SYMPTOMS  Lumbosacral strain may cause pain in the area of your injury or pain that moves (radiates) down your leg.  DIAGNOSIS Your health care provider can often diagnose lumbosacral strain through a physical exam. In some cases, you may need tests such as X-ray exams.  TREATMENT  Treatment for your lower back injury depends on many factors that your clinician will have to evaluate. However, most treatment will include the use of anti-inflammatory medicines. HOME CARE INSTRUCTIONS   Avoid hard physical activities (tennis, racquetball, waterskiing) if you are not in proper physical condition for it. This may aggravate or create problems.  If you have a back problem, avoid sports requiring sudden body movements. Swimming and walking are generally safer activities.  Maintain good posture.  Maintain a healthy weight.  For acute conditions, you may put ice on the injured area.  Put ice in a plastic bag.  Place a towel between your skin and the bag.  Leave the ice on for 20 minutes, 2-3 times a day.  When the low back starts healing, stretching and strengthening exercises may be recommended. SEEK MEDICAL CARE IF:  Your back pain is getting worse.  You experience severe back pain not relieved with medicines. SEEK IMMEDIATE  MEDICAL CARE IF:   You have numbness, tingling, weakness, or problems with the use of your arms or legs.  There is a change in bowel or bladder control.  You have increasing pain in any area of the body, including your belly (abdomen).  You notice shortness of breath, dizziness, or feel faint.  You feel sick to your stomach (nauseous), are throwing up (vomiting), or become sweaty.  You notice discoloration of your toes or legs, or your feet get very cold. MAKE SURE YOU:   Understand these instructions.  Will watch your condition.  Will get help right away if you are not doing well or get worse.   This information is not intended to replace advice given to you by your health care provider. Make sure you discuss any questions you have with your health care provider.   Document Released: 09/09/2005 Document Revised: 12/21/2014 Document Reviewed: 07/19/2013 Elsevier Interactive Patient Education [employer].

## 2015-12-11 NOTE — ED Provider Notes (Signed)
[facility] Emergency Department Provider Note  ____________________________________________  Time seen: Approximately 4:42 PM  I have reviewed the triage vital signs and the nursing notes.   HISTORY  Chief Complaint Back Pain    HPI Deborah Lawrence is a 44 y.o. female who presents emergency department complaining of lateral lower back pain and left-sided midback pain. She states that she was at work a week ago when she lifted a tray of food in a awkward position and felt a "twinge"in her back. She initially did not think anything of this but states that the pain has increased. Pain started in the left side and has increased to include right-sided lower back pain and left-sided midback pain. She now endorses some mild burning sensations and lateral left leg. Patient denies any saddle anesthesia or bowel or bladder dysfunction. Patient states pain is constant, moderate, and a burning/tight sensation.   History reviewed. No pertinent past medical history.  There are no active problems to display for this patient.   History reviewed. No pertinent past surgical history.  Current Outpatient Rx  Name  Route  Sig  Dispense  Refill  . cyclobenzaprine (FLEXERIL) 10 MG tablet   Oral   Take 1 tablet (10 mg total) by mouth 3 (three) times daily as needed for muscle spasms.   15 tablet   0   . meloxicam (MOBIC) 15 MG tablet   Oral   Take 1 tablet (15 mg total) by mouth daily.   30 tablet   0     Allergies Review of patient's allergies indicates no known allergies.  No family history on file.  Social History Social History  Substance Use Topics  . Smoking status: Current Every Day Smoker  . Smokeless tobacco: None  . Alcohol Use: None    Review of Systems Constitutional: No fever/chills Eyes: No visual changes. ENT: No sore throat. Cardiovascular: Denies chest pain. Respiratory: Denies shortness of breath. Gastrointestinal: No abdominal pain.   No nausea, no vomiting.  No diarrhea.  No constipation. Genitourinary: Negative for dysuria. Musculoskeletal: Endorses back pain. Skin: Negative for rash. Neurological: Negative for headaches, focal weakness or numbness.  10-point ROS otherwise negative.  ____________________________________________   PHYSICAL EXAM:  VITAL SIGNS: ED Triage Vitals  Enc Vitals Group     BP 12/11/15 1511 140/74 mmHg     Pulse Rate 12/11/15 1511 82     Resp 12/11/15 1511 18     Temp 12/11/15 1511 98.6 F (37 C)     Temp Source 12/11/15 1511 Oral     SpO2 12/11/15 1511 99 %     Weight 12/11/15 1636 175 lb (79.379 kg)     Height 12/11/15 1636 5\' 4"  (1.626 m)     Head Cir --      Peak Flow --      Pain Score 12/11/15 1514 8     Pain Loc --      Pain Edu? --      Excl. in GC? --     Constitutional: Alert and oriented. Well appearing and in no acute distress. Eyes: Conjunctivae are normal. PERRL. EOMI. Head: Atraumatic. Nose: No congestion/rhinnorhea. Mouth/Throat: Mucous membranes are moist.  Oropharynx non-erythematous. Neck: No stridor.  No cervical spine tenderness to palpation. Cardiovascular: Normal rate, regular rhythm. Grossly normal heart sounds.  Good peripheral circulation. Respiratory: Normal respiratory effort.  No retractions. Lungs CTAB. Gastrointestinal: Soft and nontender. No distention. No abdominal bruits. No CVA tenderness. Musculoskeletal: No lower extremity tenderness  nor edema.  No joint effusions. No visible deformity to spine upon inspection. There is no tenderness to palpation midline spinal processes. Diffuse tenderness to palpation over the thoracic and lumbar paraspinal muscle groups left side and diffuse tenderness to palpation over the paraspinal muscle groups in the lumbar region right side. Spasms are noted in these 3 regions. Negative straight leg raise bilaterally. Pulses and sensation are intact distally. Neurologic:  Normal speech and language. No gross focal  neurologic deficits are appreciated. No gait instability. Skin:  Skin is warm, dry and intact. No rash noted. Psychiatric: Mood and affect are normal. Speech and behavior are normal.  ____________________________________________   LABS (all labs ordered are listed, but only abnormal results are displayed)  Labs Reviewed - No data to display ____________________________________________  EKG   ____________________________________________  RADIOLOGY   ____________________________________________   PROCEDURES  Procedure(s) performed: None  Critical Care performed: No  ____________________________________________   INITIAL IMPRESSION / ASSESSMENT AND PLAN / ED COURSE  Pertinent labs & imaging results that were available during my care of the patient were reviewed by me and considered in my medical decision making (see chart for details).  Patient's diagnosis is consistent with muscle strain to the lumbar region with paraspinal muscle spasms. Patient does have some mild radicular symptoms and left leg. Patient will be placed on muscle relaxers and anti-inflammatories for symptom control. She is to follow-up with orthopedics or primary care for any symptoms persist past history course. Patient verbalizes understanding of her diagnosis and treatment plan and verbalizes compliance with same.   New Prescriptions   CYCLOBENZAPRINE (FLEXERIL) 10 MG TABLET    Take 1 tablet (10 mg total) by mouth 3 (three) times daily as needed for muscle spasms.   MELOXICAM (MOBIC) 15 MG TABLET    Take 1 tablet (15 mg total) by mouth daily.    ____________________________________________   FINAL CLINICAL IMPRESSION(S) / ED DIAGNOSES  Final diagnoses:  Strain of lumbar paraspinal muscle, initial encounter  Paraspinal muscle spasm  Lumbar radiculopathy      Racheal Patches, PA-C 12/11/15 1707  Loleta Rose, MD 12/12/15 1610

## 2015-12-11 NOTE — ED Notes (Signed)
Conts to have lower back pain after injury to back about 1 week ago   Ambulates well to treatment room

## 2016-01-03 ENCOUNTER — Ambulatory Visit: Payer: PRIVATE HEALTH INSURANCE | Attending: Family | Admitting: Physical Therapy

## 2016-01-03 ENCOUNTER — Encounter: Payer: Self-pay | Admitting: Physical Therapy

## 2016-01-03 DIAGNOSIS — M2560 Stiffness of unspecified joint, not elsewhere classified: Secondary | ICD-10-CM | POA: Diagnosis present

## 2016-01-03 DIAGNOSIS — M2569 Stiffness of other specified joint, not elsewhere classified: Secondary | ICD-10-CM

## 2016-01-03 DIAGNOSIS — M545 Low back pain: Secondary | ICD-10-CM | POA: Insufficient documentation

## 2016-01-03 DIAGNOSIS — M62830 Muscle spasm of back: Secondary | ICD-10-CM | POA: Insufficient documentation

## 2016-01-04 NOTE — Therapy (Signed)
[place] [facility] [address], [place], [address] Phone: [phone]   Fax:  [phone]  Physical Therapy Evaluation  Patient Details  Name: Deborah Lawrence MRN: [id number] Date of Birth: 01-18-1971 Referring Provider: Francesco Sor NP  Encounter Date: 01/03/2016      PT End of Session - 01/03/16 0946    Visit Number 1   Number of Visits 6   Date for PT Re-Evaluation 01/24/16   Authorization - Visit Number 1   Authorization - Number of Visits 6   PT Start Time 0845   PT Stop Time 0945   PT Time Calculation (min) 60 min   Activity Tolerance Patient tolerated treatment well;Patient limited by pain   Behavior During Therapy [facility] for tasks assessed/performed      History reviewed. No pertinent past medical history.  Past Surgical History  Procedure Laterality Date  . Fracture surgery Right 2012 or 2013    ORIF right ankle    There were no vitals filed for this visit.  Visit Diagnosis:  Left low back pain, with sciatica presence unspecified  Muscle spasm of back  Back stiffness      Subjective Assessment - 01/03/16 0901    Subjective Currently having pain in mid to lower back and difficulty with standing too long, lifting heavy things. She reports moving makes pain worse. Pain is worse as the day progresses, and symptoms are worse now than when they started and she is having paresthesias to left knee at times. She has trouble with bending, standing activities and did not have any pain or loss of function due to back pain prior to this problem.    Pertinent History Patient reports injuring back at work 12/05/2015 while she was pushing food carts into room, She was bending over taking a tray out of the cart and when she went to turn with it to put it on the counter she felt back a sharp pain that caught in her back and she had to lean over and catch her from falling. She immediately went to supervisor  and told she hurt her back. She rested a while and did not get any better so she went to ER 12/11/2015, She has been treated conservatively without significant changes in her pain/function and is now referred to out patient physical therapy.    Limitations House hold activities;Standing;Walking;Sitting;Lifting;Other (comment)  lifitng grandchild (74 months old), sleeping is difficulty (~ 3 hours /night) ,   How long can you sit comfortably? < 1 hour   How long can you stand comfortably? < 1 hour    How long can you walk comfortably? intermediate distances   Diagnostic tests none   Patient Stated Goals To return to prior level of function without back pain/spasms   Currently in Pain? Yes   Pain Score 8   Pain ranges from 0/10 up to 8/10   Pain Location Back   Pain Orientation Left with pain radiating as pins and needles to left LE   Pain Descriptors / Indicators Aching;Stabbing;Spasm;Tightness   Pain Type Acute pain   Pain Onset 1 to 4 weeks ago   Pain Frequency Constant   Aggravating Factors  bending, standing   Pain Relieving Factors rest, lying down   Effect of Pain on Daily Activities prevents her from performing daily tasks for personal care and work activties without difficulty             [facility] PT Assessment -  01/03/16 0854    Assessment   Medical Diagnosis lumbar sprain, thoracic strain   Referring Provider Francesco Sor FNP   Onset Date/Surgical Date 12/05/15   Hand Dominance Right   Next MD Visit 01/10/2016   Prior Therapy none   Precautions   Precautions Other (comment)   Precaution Comments no lifting over 5#, no bending, pushing    Restrictions   Weight Bearing Restrictions No   Balance Screen   Has the patient fallen in the past 6 months No   Has the patient had a decrease in activity level because of a fear of falling?  No   Is the patient reluctant to leave their home because of a fear of falling?  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, bend, stand       Objective: Gait: antalgic, decreased weight shift to left LE Posture: standing: decreased weight shift to left, able to correct with verbal cues, decreased lumbar lordosis, guarded posture AROM: lumbar spine: limited forward flexion 50% with lean to left, extension decreased 75% with pain in lower back, lateral flexion right and left equal [facility] with increased pain on left with side bend to left  Strength: both LE's grossly WFL's major muscle groups tested: hip flexion (with some giving way left and right because of reported pain, knee extension/flexion, ankle DF/PF great toe extension, both UE's grossly WNL's major muscle groups without pain reported Sensation; grossly intact to light touch both LE's Palpation: increased spasms throughout spine thoracic to lumbar region in standing and prone lying Special tests: sitting slump with knee extension did not recreate symptoms, SLR deferred         PT Education - 01/03/16 0945    Education provided Yes   Education Details educated in posture with more equal weight bearing on both LE's, use of lumbar roll for sitting, choice of sitting surfaces ( no soft counes or chairs), HEP for posture, positioning on side/stomach to relieve symptoms   Person(s) Educated Patient   Methods Explanation;Demonstration;Verbal cues;Handout   Comprehension Verbalized understanding;Returned demonstration;Verbal cues required             PT Long Term Goals - 01/03/16 0945    PT LONG TERM GOAL #1   Title Patient will report max pain level in back to 4/10 with aggravating activties of standing >1 hour or bending by 01/24/2016   Baseline pain level max 8/10 with standing, bending activities   Status New   PT LONG TERM GOAL #2   Title Patient will demonstrate improved function with daily tasks for personal care, work as  indicated by modified oswestry score of 35% or less by 01/24/2016   Baseline MODI 50% (severe self perceived disability)   Status New   PT LONG TERM GOAL #3   Title Patient will be independen with home program for pain control, posture awareness and progressive exercises by 01/24/2016   Baseline Patient has no knowledge of appropriate pain control strategies or posture awareness, exercise progression   Status New               Plan - 01/03/16 0945    Clinical Impression Statement Patient is a 45 year old female who presents with spasms and pain in back from injury sustained on 12/05/2015 while at work. Currently having pain in mid to lower back and difficulty with  standing too long, lifting heavy things. She reports moving makes pain worse. Pain is worse as the day progresses, and symptoms are worse now than when they started. She has trouble with bending, standing activities and did not have any pain or loss of function due to back pain prior to this problem. Limitations include spasms in back, stiffness and limited ROM with pain ranging form 0-8/10 with standing/bending activities. She has no knowledge of appropriate pain control strategies, posture awareness or exercise progression to be able to return to prior level of function.    Pt will benefit from skilled therapeutic intervention in order to improve on the following deficits Pain;Decreased range of motion;Increased muscle spasms;Improper body mechanics   Rehab Potential Good   Clinical Impairments Affecting Rehab Potential (+) age, acute condition  (-) continued high pain level s/p injury 12/05/2015, worseing symptoms per patient report   PT Frequency 2x / week   PT Duration 3 weeks   PT Treatment/Interventions Manual techniques;Cryotherapy;Electrical Stimulation;Neuromuscular re-education;Ultrasound;Patient/family education;Moist Heat;Therapeutic exercise   PT Next Visit Plan pain control, posture awareness, body mechanics training,  ROM exercise   PT Home Exercise Plan posture awareness, use of lumbar roll for support, ROM exercises   Consulted and Agree with Plan of Care Patient         Problem List There are no active problems to display for this patient.   Beacher May PT 01/04/2016, 12:56 PM  [place] [facility]YS[facility] AND SPORTS MEDICINE [address], [place], [address] Phone: [phone]   Fax:  [phone]  Name: PORSCHA AXLEY MRN: [id number] Date of Birth: 08-Apr-1971

## 2016-01-06 ENCOUNTER — Ambulatory Visit: Payer: PRIVATE HEALTH INSURANCE | Admitting: Physical Therapy

## 2016-01-06 ENCOUNTER — Encounter: Payer: Self-pay | Admitting: Physical Therapy

## 2016-01-06 DIAGNOSIS — M545 Low back pain: Secondary | ICD-10-CM | POA: Diagnosis not present

## 2016-01-06 DIAGNOSIS — M62830 Muscle spasm of back: Secondary | ICD-10-CM

## 2016-01-06 DIAGNOSIS — M2560 Stiffness of unspecified joint, not elsewhere classified: Secondary | ICD-10-CM

## 2016-01-06 DIAGNOSIS — M2569 Stiffness of other specified joint, not elsewhere classified: Secondary | ICD-10-CM

## 2016-01-07 NOTE — Therapy (Signed)
[place] [facility] [address], [place], [address] Phone: [phone]   Fax:  [phone]  Physical Therapy Treatment  Patient Details  Name: Deborah Lawrence MRN: [id number] Date of Birth: 1971-05-18 Referring Provider: Francesco Sor NP  Encounter Date: 01/06/2016      PT End of Session - 01/06/16 1624    Visit Number 2   Number of Visits 6   Date for PT Re-Evaluation 01/24/16   Authorization - Visit Number 2   Authorization - Number of Visits 6   PT Start Time 1615   PT Stop Time 1703   PT Time Calculation (min) 48 min   Activity Tolerance Patient tolerated treatment well;Patient limited by pain   Behavior During Therapy [facility] for tasks assessed/performed      History reviewed. No pertinent past medical history.  Past Surgical History  Procedure Laterality Date  . Fracture surgery Right 2012 or 2013    ORIF right ankle    There were no vitals filed for this visit.  Visit Diagnosis:  Left low back pain, with sciatica presence unspecified  Muscle spasm of back  Back stiffness      Subjective Assessment - 01/06/16 1618    Subjective Patient currently reports she is about the same as last week and she is unable to get pain relief except with Oxycodone medication and can only take that at night to sleep (relaxes her). She reports that when she goes to work and is up a few hours the pain gets worse and continues to worsen throughout the day.    Currently in Pain? Yes   Pain Score 9    Pain Location Back   Pain Orientation Left   Pain Descriptors / Indicators Aching;Tingling;Pressure;Tightness   Pain Type Acute pain   Pain Onset 1 to 4 weeks ago   Pain Frequency Constant      Objective: Gait: guarded posture with walking, standing  Palpation; general spasms throughout her lumbar spine ROM: patient supine lying: SLR limited to 45 degrees bilaterally with complaints of increased left lower  back pain, lower trunk rotation in hooklying limited with pain in left side lower back, lumbar spine flexion in standing: improved from previous session without deviation to left   Treatment:  Exercise; re assessed home instructions for back ROM to perform partial range extension as tolerated, prone or side lying and prone extension on elbows x 2 min., patient transferred to treatment table with slow cautious motion and rolled supine to side lying to prone with guarded, slow motion Electrical stimulation applied by therapist: High volt to reduce spasms/pain (2) electrodes applied to each side lumbar spine with patient 3/4 prone with pillows for support with ice pack applied to same: to patient tolerance to intensity x 20 min.  Patient response to treatment: Patient reports continued pain in back with stiffness, able to transfer off treatment table with less difficulty than at beginning of session, verbalized understanding of home posture/positioning to relieve spasms and control pain as able           PT Education - 01/06/16 1630    Education provided Yes   Education Details HEP for use of lumbar roll, continue with prone over pillows if it helps and lying on side, keep neutral posiitons as much as possible    Person(s) Educated Patient   Methods Explanation;Demonstration   Comprehension Verbalized understanding  PT Long Term Goals - 01/03/16 0945    PT LONG TERM GOAL #1   Title Patient will report max pain level in back to 4/10 with aggravating activties of standing >1 hour or bending by 01/24/2016   Baseline pain level max 8/10 with standing, bending activities   Status New   PT LONG TERM GOAL #2   Title Patient will demonstrate improved function with daily tasks for personal care, work as indicated by modified oswestry score of 35% or less by 01/24/2016   Baseline MODI 50% (severe self perceived disability)   Status New   PT LONG TERM GOAL #3   Title Patient will be  independen with home program for pain control, posture awareness and progressive exercises by 01/24/2016   Baseline Patient has no knowledge of appropriate pain control strategies or posture awareness, exercise progression   Status New               Plan - 01/06/16 1705    Clinical Impression Statement Patient continues with spasms and pain in her back that is limiting all daily activties. She continues with guarded posture and was moving with less difficulty today in general. She continues to require physical therapy for pain control, reduction of spasms in order to improve ability to move and to return to prior level of function.   Pt will benefit from skilled therapeutic intervention in order to improve on the following deficits Pain;Decreased range of motion;Increased muscle spasms;Improper body mechanics   Rehab Potential Good   PT Frequency 2x / week   PT Duration 3 weeks   PT Treatment/Interventions Manual techniques;Cryotherapy;Electrical Stimulation;Neuromuscular re-education;Ultrasound;Patient/family education;Moist Heat;Therapeutic exercise   PT Next Visit Plan pain control, posture awareness, body mechanics training, ROM exercise   PT Home Exercise Plan posture awareness, use of lumbar roll for support, ROM exercises        Problem List There are no active problems to display for this patient.   Beacher May PT 01/07/2016, 10:06 AM   [facility] PHYSICAL AND SPORTS MEDICINE [address] [place], [address] Phone: [phone]   Fax:  [phone]  Name: TANZA PELLOT MRN: [id number] Date of Birth: 18-Dec-1970

## 2016-01-09 ENCOUNTER — Encounter: Payer: Self-pay | Admitting: Physical Therapy

## 2016-01-09 ENCOUNTER — Ambulatory Visit: Payer: PRIVATE HEALTH INSURANCE | Admitting: Physical Therapy

## 2016-01-09 DIAGNOSIS — M62830 Muscle spasm of back: Secondary | ICD-10-CM

## 2016-01-09 DIAGNOSIS — M545 Low back pain: Secondary | ICD-10-CM

## 2016-01-09 DIAGNOSIS — M2569 Stiffness of other specified joint, not elsewhere classified: Secondary | ICD-10-CM

## 2016-01-09 DIAGNOSIS — M2560 Stiffness of unspecified joint, not elsewhere classified: Secondary | ICD-10-CM

## 2016-01-10 NOTE — Therapy (Signed)
[place] [facility] [address], [place], [address] Phone: [phone]   Fax:  [phone]  Physical Therapy Treatment  Patient Details  Name: Deborah Lawrence MRN: [id number] Date of Birth: 01/16/1971 Referring Provider: Francesco Sor NP  Encounter Date: 01/09/2016      PT End of Session - 01/09/16 1700    Visit Number 3   Number of Visits 6   Date for PT Re-Evaluation 01/24/16   Authorization - Visit Number 3   Authorization - Number of Visits 6   PT Start Time 1603   PT Stop Time 1645   PT Time Calculation (min) 42 min   Activity Tolerance Patient tolerated treatment well;Patient limited by pain   Behavior During Therapy [facility] for tasks assessed/performed      History reviewed. No pertinent past medical history.  Past Surgical History  Procedure Laterality Date  . Fracture surgery Right 2012 or 2013    ORIF right ankle    There were no vitals filed for this visit.  Visit Diagnosis:  Left low back pain, with sciatica presence unspecified  Muscle spasm of back  Back stiffness      Subjective Assessment - 01/09/16 1609    Subjective Patient reports she is reacting to the medicine hydroconode and it is helping at this time. She is alternating her medication at night and that is the only time she takes it to relax her muscles.    Limitations House hold activities;Standing;Walking;Sitting;Lifting;Other (comment)  sleeping 3-4 hours a night, up and down at night   Patient Stated Goals To return to prior level of function without back pain/spasms   Currently in Pain? Yes   Pain Score 7    Pain Location Back   Pain Orientation Left   Pain Descriptors / Indicators Aching;Pressure;Tightness;Heaviness  heaviness in both legs if standing still for a period of time   Pain Type Acute pain   Pain Onset More than a month ago  12/05/2015   Pain Frequency Other (Comment)  only with medication at night        Objective: Palpation; + moderate spasms along both side lumbar spine left side>right AROM: lumbar spine flexion: able to flex forward with minor to no deviation to left today as compared to previous session, extension 50% decreased with pain        [facility] Adult PT Treatment/Exercise - 01/09/16 1616    Exercises   Exercises Other Exercises   Other Exercises  patient performed exercises with guidance, verbal and tactile cues and demonstration of PT: seated hip adduction with ball with glute sets x 10 reps, hip abduction with ER with resistive band x 15 reps with demonstration/verbal cues, standing walk forward and backwards x 1 min. Side step x 30 seconds                          Patient response to treatment: patient able to perform exercises with verbal cuing and demonstration, slow, cautious movements throughout session and no worse with spasms or pain at end of treatment session           PT Education - 01/09/16 1700    Education provided Yes   Education Details HEP for walking forward, backward, side stepping at counter, use ball for stabilization with hip adduction and glute sets, resistive band for hip abduction in sitting   Person(s) Educated Patient   Methods Explanation;Demonstration;Verbal cues;Handout  Comprehension Verbalized understanding;Returned demonstration;Verbal cues required             PT Long Term Goals - 01/03/16 0945    PT LONG TERM GOAL #1   Title Patient will report max pain level in back to 4/10 with aggravating activties of standing >1 hour or bending by 01/24/2016   Baseline pain level max 8/10 with standing, bending activities   Status New   PT LONG TERM GOAL #2   Title Patient will demonstrate improved function with daily tasks for personal care, work as indicated by modified oswestry score of 35% or less by 01/24/2016   Baseline MODI 50% (severe self perceived disability)   Status New   PT LONG TERM GOAL #3   Title Patient will  be independen with home program for pain control, posture awareness and progressive exercises by 01/24/2016   Baseline Patient has no knowledge of appropriate pain control strategies or posture awareness, exercise progression   Status New               Plan - 01/09/16 1700    Clinical Impression Statement Patient with decreased spasms today and able to begin exercising with guidance and verbal cuing. She should continue to progress with guided exercises as she heals from back strain.   Pt will benefit from skilled therapeutic intervention in order to improve on the following deficits Pain;Decreased range of motion;Increased muscle spasms;Improper body mechanics   Rehab Potential Good   PT Frequency 2x / week   PT Duration 3 weeks   PT Treatment/Interventions Manual techniques;Cryotherapy;Electrical Stimulation;Neuromuscular re-education;Ultrasound;Patient/family education;Moist Heat;Therapeutic exercise   PT Next Visit Plan pain control, posture awareness, body mechanics training, ROM exercise   PT Home Exercise Plan home exercises for movement and stabilization        Problem List There are no active problems to display for this patient.   Beacher May PT 01/10/2016, 5:12 PM  [place] [facility] PHYSICAL AND SPORTS MEDICINE [address], [place], [address] Phone: [phone]   Fax:  [phone]  Name: Deborah Lawrence MRN: [id number] Date of Birth: Jul 04, 1971

## 2016-01-13 ENCOUNTER — Encounter: Payer: Self-pay | Admitting: Physical Therapy

## 2016-01-13 ENCOUNTER — Ambulatory Visit: Payer: PRIVATE HEALTH INSURANCE | Admitting: Physical Therapy

## 2016-01-13 DIAGNOSIS — M2569 Stiffness of other specified joint, not elsewhere classified: Secondary | ICD-10-CM

## 2016-01-13 DIAGNOSIS — M2560 Stiffness of unspecified joint, not elsewhere classified: Secondary | ICD-10-CM

## 2016-01-13 DIAGNOSIS — M62830 Muscle spasm of back: Secondary | ICD-10-CM

## 2016-01-13 DIAGNOSIS — M545 Low back pain: Secondary | ICD-10-CM

## 2016-01-13 NOTE — Therapy (Signed)
[facility] [address] [place], [address] Phone: [phone]   Fax:  [phone]  Physical Therapy Treatment  Patient Details  Name: Deborah Lawrence MRN: [id number] Date of Birth: 17-Nov-1971 Referring Provider: Francesco Sor NP  Encounter Date: 01/13/2016      PT End of Session - 01/13/16 1550    Visit Number 4   Number of Visits 6   Date for PT Re-Evaluation 01/24/16   Authorization - Visit Number 4   Authorization - Number of Visits 6   PT Start Time 1540   PT Stop Time 1620   PT Time Calculation (min) 40 min   Activity Tolerance Patient tolerated treatment well;Patient limited by pain   Behavior During Therapy [facility] for tasks assessed/performed      History reviewed. No pertinent past medical history.  Past Surgical History  Procedure Laterality Date  . Fracture surgery Right 2012 or 2013    ORIF right ankle    There were no vitals filed for this visit.  Visit Diagnosis:  Left low back pain, with sciatica presence unspecified  Muscle spasm of back  Back stiffness      Subjective Assessment - 01/13/16 1545    Subjective Patient reports she is compliant with home exericses and is not having any worsening symptoms with exercises at this time. She was seen at MD office Friday and is still on light duty until follow up with MD this week.    Limitations House hold activities;Standing;Walking;Sitting;Lifting;Other (comment)  Sleeping with new medication   Patient Stated Goals To return to prior level of function without back pain/spasms   Currently in Pain? Yes   Pain Descriptors / Indicators Aching;Pressure;Tightness   Pain Type Acute pain   Pain Onset More than a month ago  12/05/2015   Pain Frequency Other (Comment)  increases with as the day progresses with work related activties and at night       Objective: AROM lumbar spine: forward flexion 25% decreased (able to reach  shins) with mild deviation to right on return to erect posture, extension 25% decreased Palpation; moderate spasms along bilateral lumbar spine paraspinal muscles with tenderness to palpation and gentle PA mobilization        [facility] Adult PT Treatment/Exercise - 01/13/16 1550    Exercises   Exercises Other Exercises   Other Exercises  patient performed exercises with guidance, verbal and tactile cues and demonstration of PT: Sitting stabilization with ball; hip adduction with glute sets with TrA contraction x 10 reps, hip abduction with resistive band x 15 reps, Omega cable exercise: seated rows with 10# x 15 reps, standing straight arm pull downs x 10, reverse chin up seated 15# x 10 reps, instructed in use of resistive bands for home for shoulder rows and extension to hips, side lying clam x 10 reps, prone hip extension x 5 reps each LE   Manual Therapy   Manual Therapy Soft tissue mobilization;   Manual therapy comments increased spasms throughout lumbar spine   Soft tissue mobilization STM lumbar spine performed by PT with superficial techniques to decrease spasms and tenderness along bilateral lumbar spine paraspinal muscles         Patient response to treatment: patient demonstrated improved techniques with exercises for stabilization with verbal cues and demonstration. She continued with tenderness along lumbar paraspinal muscles with noted decreased spasms as compared to previous sessions.  PT Education - 01/13/16 1550    Education provided Yes   Education Details HEP for stabilization with resistive band shoulder rows, extension to hips and continue wtih walking exercises    Person(s) Educated Patient   Methods Explanation;Demonstration;Verbal cues;Handout   Comprehension Verbalized understanding;Returned demonstration;Verbal cues required             PT Long Term Goals - 01/03/16 0945    PT LONG TERM GOAL #1   Title Patient will report max pain level in  back to 4/10 with aggravating activties of standing >1 hour or bending by 01/24/2016   Baseline pain level max 8/10 with standing, bending activities   Status New   PT LONG TERM GOAL #2   Title Patient will demonstrate improved function with daily tasks for personal care, work as indicated by modified oswestry score of 35% or less by 01/24/2016   Baseline MODI 50% (severe self perceived disability)   Status New   PT LONG TERM GOAL #3   Title Patient will be independen with home program for pain control, posture awareness and progressive exercises by 01/24/2016   Baseline Patient has no knowledge of appropriate pain control strategies or posture awareness, exercise progression   Status New               Plan - 01/13/16 1621    Clinical Impression Statement Patient demonstrated improved abiltiy to perform exercises with verbal cues and demosration. She continues with muscle spasms in lumbar spine and is progressing with exercises and improving activity with daily tasks slowly. She should continue to improve with additional physical therapy intervention.     Pt will benefit from skilled therapeutic intervention in order to improve on the following deficits Pain;Decreased range of motion;Increased muscle spasms;Improper body mechanics   Rehab Potential Good   PT Frequency 2x / week   PT Duration 3 weeks   PT Treatment/Interventions Manual techniques;Cryotherapy;Electrical Stimulation;Neuromuscular re-education;Ultrasound;Patient/family education;Moist Heat;Therapeutic exercise   PT Next Visit Plan pain control, posture awareness, body mechanics training, ROM exercise        Problem List There are no active problems to display for this patient.   Beacher May PT 01/14/2016, 8:19 AM  [facility] [address] [place], [address] Phone: [phone]   Fax:  [phone]  Name: KIDA DIGIULIO MRN:  [id number] Date of Birth: [birth year]

## 2016-01-16 ENCOUNTER — Encounter: Payer: Self-pay | Admitting: Physical Therapy

## 2016-01-16 ENCOUNTER — Ambulatory Visit: Payer: PRIVATE HEALTH INSURANCE | Attending: Family | Admitting: Physical Therapy

## 2016-01-16 DIAGNOSIS — M545 Low back pain: Secondary | ICD-10-CM | POA: Insufficient documentation

## 2016-01-16 DIAGNOSIS — M62830 Muscle spasm of back: Secondary | ICD-10-CM | POA: Diagnosis present

## 2016-01-16 DIAGNOSIS — M2569 Stiffness of other specified joint, not elsewhere classified: Secondary | ICD-10-CM

## 2016-01-16 DIAGNOSIS — M2560 Stiffness of unspecified joint, not elsewhere classified: Secondary | ICD-10-CM | POA: Insufficient documentation

## 2016-01-17 NOTE — Therapy (Signed)
[place] [facility] [address], [place], [address] Phone: [phone]   Fax:  [phone]  Physical Therapy Treatment  Patient Details  Name: Deborah Lawrence MRN: [id number] Date of Birth: Jun 23, 1971 Referring Provider: Blima Singer NP  Encounter Date: 01/16/2016      PT End of Session - 01/16/16 1710    Visit Number 5   Number of Visits 6   Date for PT Re-Evaluation 01/24/16   Authorization - Visit Number 5   Authorization - Number of Visits 6   PT Start Time 1625   PT Stop Time 1710   PT Time Calculation (min) 45 min   Activity Tolerance Patient limited by pain   Behavior During Therapy [facility] for tasks assessed/performed      History reviewed. No pertinent past medical history.  Past Surgical History  Procedure Laterality Date  . Fracture surgery Right 2012 or 2013    ORIF right ankle    There were no vitals filed for this visit.  Visit Diagnosis:  Left low back pain, with sciatica presence unspecified  Muscle spasm of back  Back stiffness      Subjective Assessment - 01/16/16 1628    Subjective Patient reports she continues to have pain in her back and the only relief at the moment is her pain medication and when she lies down. Patient reports she is compliant with home exericses and is having worsening symptoms with exercises at this time. She feels like she has increased throbbing in her back with resistive band exercises for hip stabilizaiton and shoulder stabilizaiton in standing with light  resistance. She is only able to tolerate 1 hour at work before her pain increases for the rest of the day and is only relieved with medicaiton and rest. No significant difference in spasms or pain since beginning therapy.    Limitations House hold activities;Standing;Walking;Sitting;Lifting;Other (comment)  sleeping, work related activties   How long can you stand comfortably? after standing for 1 hour  when she arrives to work the pain begins and is there the rest of the day until she can take her medicaiton at night   Patient Stated Goals To return to prior level of function without back pain/spasms   Currently in Pain? Yes   Pain Score 7    Pain Location Back   Pain Orientation Left   Pain Descriptors / Indicators Aching;Pressure;Tightness;Heaviness  like a weight is pushing on pelvis   Pain Type Acute pain   Pain Onset More than a month ago  12/05/2015   Pain Frequency Other (Comment)  after she stands for about an hour once arriving at work, pain continues until nighttime   Aggravating Factors  bending, standing   Pain Relieving Factors rest lying down, pain medication, when not working is better   Effect of Pain on Daily Activities uable to perform daily tasks at work, home wihtout difficulty due to back spasms/pain       Objective: AROM lumbar spine flexion: slow cautious movement 50% limited with deviation to right on returning to stand, extension 50% limited with pain and deviates slightly to right, repeated movement no change in symptoms, lateral flexion right and left able to reach to knee with fingertips with pain reported with both right and left Posture: standing in forward flexed posture at hips, able to stand erect when requested  Palpation: spasms with increased tone throughout spine bilaterally with tenderness thoracic to lumbar region, standing and prone  and side lying with minimal change noted         [facility] Adult PT Treatment/Exercise - 01/16/16 1636    Exercises   Exercises Other Exercises   Other Exercises  patient performed exercises with guidance, verbal and tactile cues and demonstration of PT: re assessed home exercises for side lying and prone lying to improve mobility and posture while decreasing pain.Omega cable exercise: seated rows with 10# x 15 reps, standing straight arm pull downs x 10, reverse chin up seated 15# x 10 reps, all with guidance and  assistance of PT   Manual Therapy   Manual Therapy Soft tissue mobilization;Myofascial release   Manual therapy comments increased spasms throughout lumbar and thoracic spine   Soft tissue mobilization STM thoracic and lumbar spine   Myofascial Release STM performed by PT using superficial techniques to release spasms along left and right lumbar spine with patient prone over one pillow Followed by moist heat applied to same x 10 min.at end of session      Patient response to treatment: no worse pain with exercises for strengthening, minimal change noted in pain or spasms following soft tissue mobilization, continued with significant tenderness along left side thoracic spine to lumbar spine          PT Education - 01/16/16 1645    Education provided Yes   Education Details HEP: modify exercises and perform with minimal to no symptoms and no worseing symptoms following exercises, perform every other day not daily   Person(s) Educated Patient   Methods Explanation   Comprehension Verbalized understanding             PT Long Term Goals - 01/17/16 0849    PT LONG TERM GOAL #1   Title Patient will report max pain level in back to 4/10 with aggravating activties of standing >1 hour or bending by 01/24/2016   Baseline pain level max 8/10 with standing, bending activities   Status Not Met   PT LONG TERM GOAL #2   Title Patient will demonstrate improved function with daily tasks for personal care, work as indicated by modified oswestry score of 35% or less by 01/24/2016   Baseline MODI 50% (severe self perceived disability)   Status Not Met   PT LONG TERM GOAL #3   Title Patient will be independen with home program for pain control, posture awareness and progressive exercises by 01/24/2016   Baseline Patient has no knowledge of appropriate pain control strategies or posture awareness, exercise progression (unable to progress due to continued elevated pain level)   Status Partially Met                Plan - 01/16/16 1710    Clinical Impression Statement Patient has attended 5 sessions of physical therpay for back pain/spasms with interventions including manual therapy for soft tissue, joint mobilizaiton, modalities to decrease spasms/pain, ROM and stabilizatoin exercises and education in home program  for posture /body mechanics and exercises. She continues with 7/10 pain level, significant spasms, limited lumbar spine ROM with deviations to the right with flexion and extension and no significant changes in funcition due to her pain. Recommend discontinue therapy at this time and have further evaluation for source of continued pain/spasms prior to returning.    Pt will benefit from skilled therapeutic intervention in order to improve on the following deficits Pain;Decreased range of motion;Increased muscle spasms;Improper body mechanics   Rehab Potential Good   PT Frequency 2x / week   PT  Duration 3 weeks   PT Treatment/Interventions Manual techniques;Cryotherapy;Electrical Stimulation;Neuromuscular re-education;Ultrasound;Patient/family education;Moist Heat;Therapeutic exercise   PT Next Visit Plan to follow up with MD to determine furhter course of treatment   PT Home Exercise Plan home exercises for movement and stabilization and pain control   Consulted and Agree with Plan of Care Patient        Problem List There are no active problems to display for this patient.   Jomarie Longs PT 01/17/2016, 8:51 AM  [place] [facility] [address] [place], [address] Phone: [phone]   Fax:  [phone]  Name: Deborah Lawrence MRN: [id number] Date of Birth: Nov 04, 1971

## 2016-01-20 ENCOUNTER — Ambulatory Visit: Payer: PRIVATE HEALTH INSURANCE | Admitting: Physical Therapy

## 2016-01-23 ENCOUNTER — Encounter: Payer: PRIVATE HEALTH INSURANCE | Admitting: Physical Therapy

## 2016-02-03 ENCOUNTER — Other Ambulatory Visit: Payer: Self-pay | Admitting: Orthopedic Surgery

## 2016-02-03 DIAGNOSIS — M5116 Intervertebral disc disorders with radiculopathy, lumbar region: Secondary | ICD-10-CM

## 2016-02-21 ENCOUNTER — Ambulatory Visit
Admission: RE | Admit: 2016-02-21 | Discharge: 2016-02-21 | Disposition: A | Discharge status: Home / Self Care | Payer: PRIVATE HEALTH INSURANCE | Source: Ambulatory Visit | Attending: Orthopedic Surgery | Admitting: Orthopedic Surgery

## 2016-02-21 DIAGNOSIS — M5127 Other intervertebral disc displacement, lumbosacral region: Secondary | ICD-10-CM | POA: Diagnosis not present

## 2016-02-21 DIAGNOSIS — M5116 Intervertebral disc disorders with radiculopathy, lumbar region: Secondary | ICD-10-CM | POA: Insufficient documentation

## 2016-04-14 ENCOUNTER — Other Ambulatory Visit: Payer: Self-pay | Admitting: Orthopedic Surgery

## 2016-04-14 DIAGNOSIS — M7918 Myalgia, other site: Secondary | ICD-10-CM

## 2016-04-14 DIAGNOSIS — G8929 Other chronic pain: Secondary | ICD-10-CM

## 2016-04-14 DIAGNOSIS — M545 Low back pain, unspecified: Secondary | ICD-10-CM

## 2016-04-22 ENCOUNTER — Ambulatory Visit
Admission: RE | Admit: 2016-04-22 | Discharge: 2016-04-22 | Disposition: A | Discharge status: Home / Self Care | Payer: Self-pay | Source: Ambulatory Visit | Attending: Orthopedic Surgery | Admitting: Orthopedic Surgery

## 2016-04-22 ENCOUNTER — Ambulatory Visit
Admission: RE | Admit: 2016-04-22 | Discharge: 2016-04-22 | Disposition: A | Discharge status: Home / Self Care | Payer: Worker's Compensation | Source: Ambulatory Visit | Attending: Orthopedic Surgery | Admitting: Orthopedic Surgery

## 2016-04-22 DIAGNOSIS — M7918 Myalgia, other site: Secondary | ICD-10-CM

## 2016-04-22 DIAGNOSIS — G8929 Other chronic pain: Secondary | ICD-10-CM

## 2016-04-22 DIAGNOSIS — M545 Low back pain: Principal | ICD-10-CM

## 2016-07-20 ENCOUNTER — Other Ambulatory Visit: Payer: Self-pay | Admitting: Orthopedic Surgery

## 2016-07-20 DIAGNOSIS — G8929 Other chronic pain: Secondary | ICD-10-CM

## 2016-07-20 DIAGNOSIS — M545 Low back pain: Principal | ICD-10-CM

## 2016-07-30 ENCOUNTER — Ambulatory Visit
Admission: RE | Admit: 2016-07-30 | Discharge: 2016-07-30 | Disposition: A | Discharge status: Home / Self Care | Payer: Worker's Compensation | Source: Ambulatory Visit | Attending: Orthopedic Surgery | Admitting: Orthopedic Surgery

## 2016-07-30 ENCOUNTER — Other Ambulatory Visit: Payer: Self-pay

## 2016-07-30 DIAGNOSIS — M545 Low back pain: Principal | ICD-10-CM

## 2016-07-30 DIAGNOSIS — G8929 Other chronic pain: Secondary | ICD-10-CM

## 2016-07-30 NOTE — Discharge Instructions (Signed)

## 2017-03-25 DIAGNOSIS — Z3009 Encounter for other general counseling and advice on contraception: Secondary | ICD-10-CM | POA: Diagnosis not present

## 2017-03-25 DIAGNOSIS — Z3200 Encounter for pregnancy test, result unknown: Secondary | ICD-10-CM | POA: Diagnosis not present

## 2018-01-08 IMAGING — CT CT BIOPSY
4 of 7 series · 12 of 32 positions shown, 16 images · non-contrast
Comparison: none

CLINICAL DATA: Chronic left-sided low back and left leg pain.
Sacroiliac dysfunction.

[Series 5: needle -guided injection · axial · 0.75mm/px · z∈[+776,+794]mm · 3 of 12 slices shown, 7 images (1 of 4)]
[im 3/12  soft-tissue]
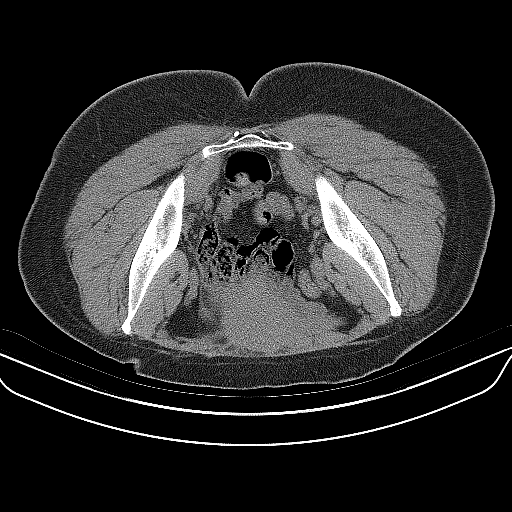
[im 3/12  lung]
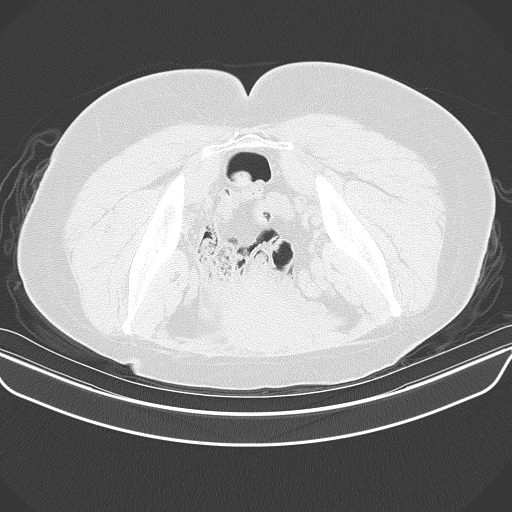
[im 3/12  bone]
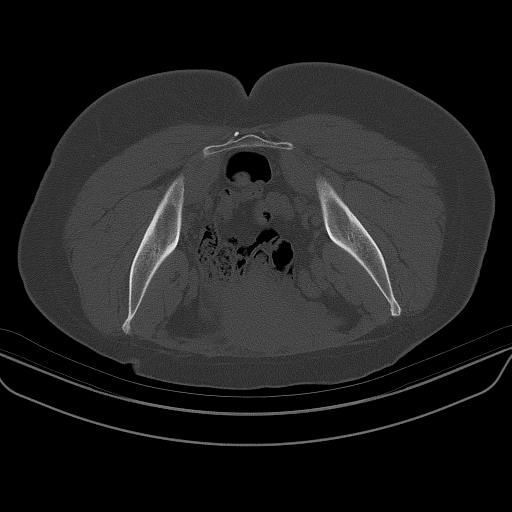
[im 6/12  soft-tissue]
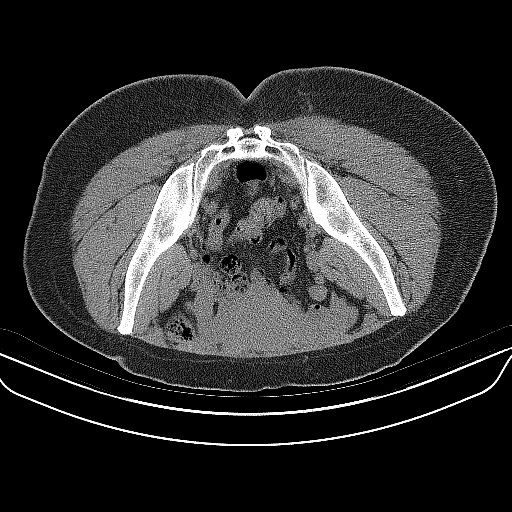
[im 6/12  lung]
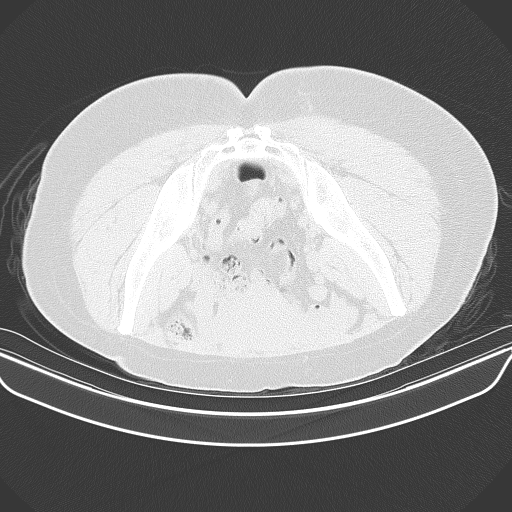
[im 9/12  soft-tissue]
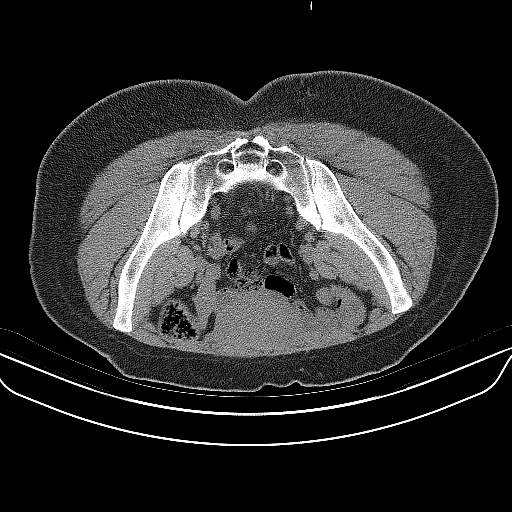
[im 9/12  lung]
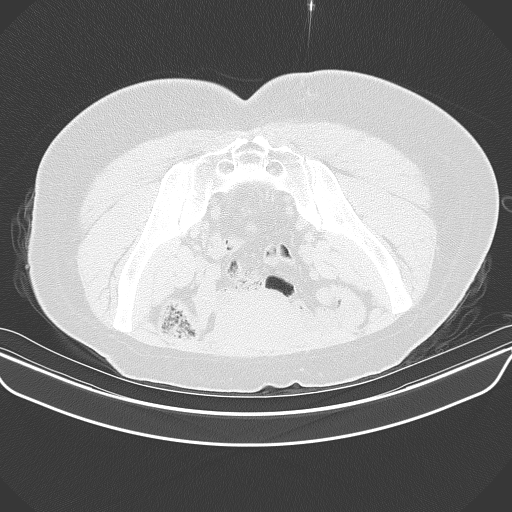

[Series 6: needle -guided injection · axial · 0.75mm/px · z∈[+776,+794]mm · 3 of 12 slices shown (2 of 4)]
[im 3/12  soft-tissue]
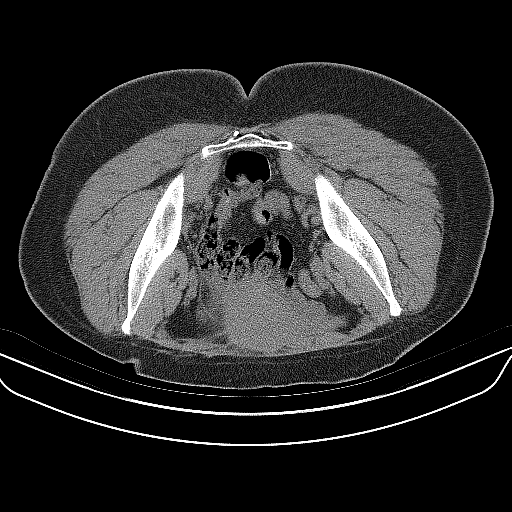
[im 6/12  soft-tissue]
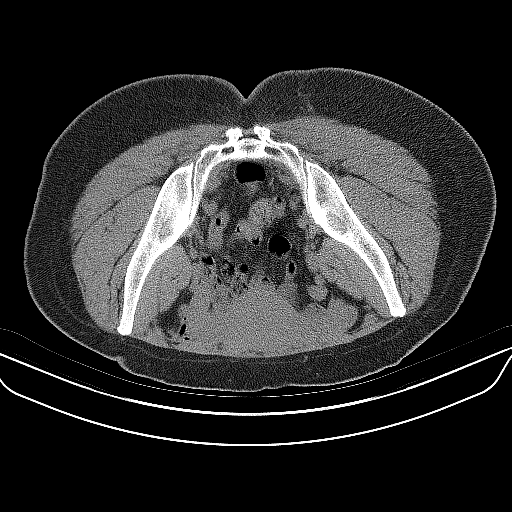
[im 9/12  soft-tissue]
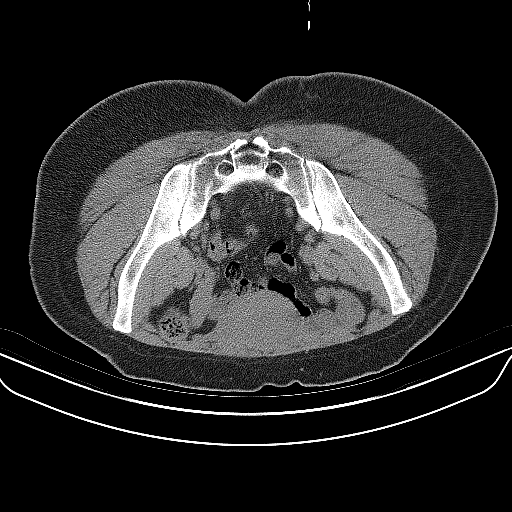

[Series 7: needle -guided injection · axial · 0.75mm/px · z∈[+776,+794]mm · 3 of 12 slices shown (3 of 4)]
[im 3/12  soft-tissue]
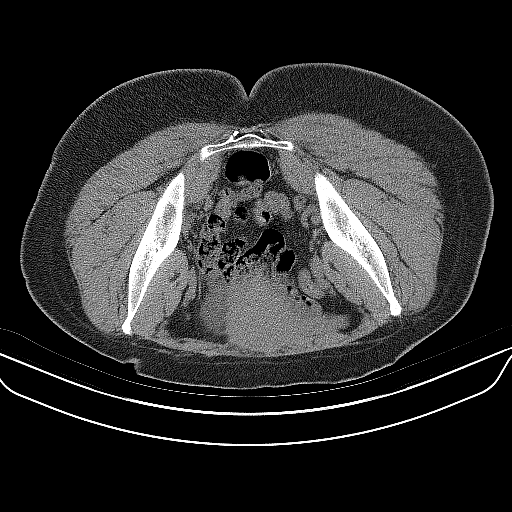
[im 6/12  soft-tissue]
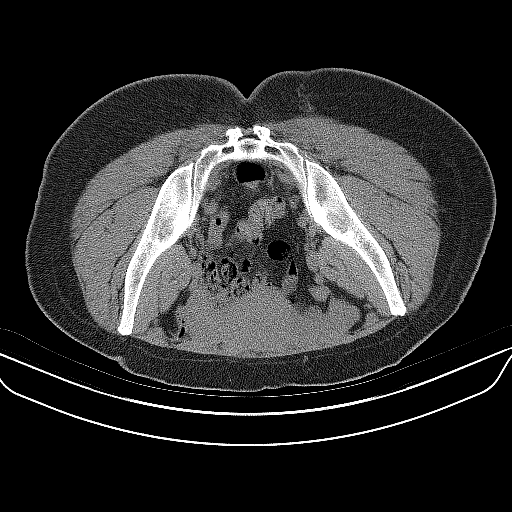
[im 9/12  soft-tissue]
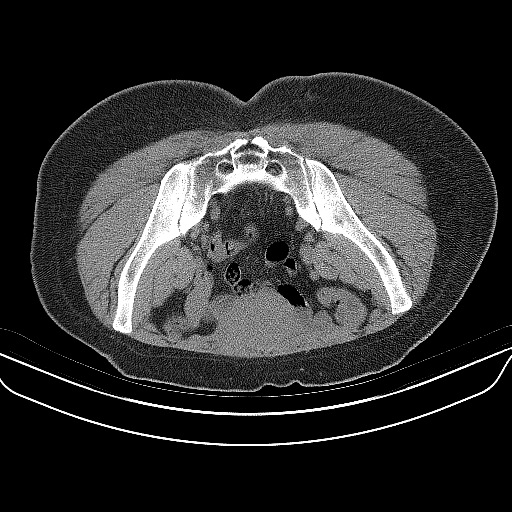

[Series 8: needle -guided injection · axial · 0.75mm/px · z∈[+776,+794]mm · 3 of 12 slices shown (4 of 4)]
[im 3/12  soft-tissue]
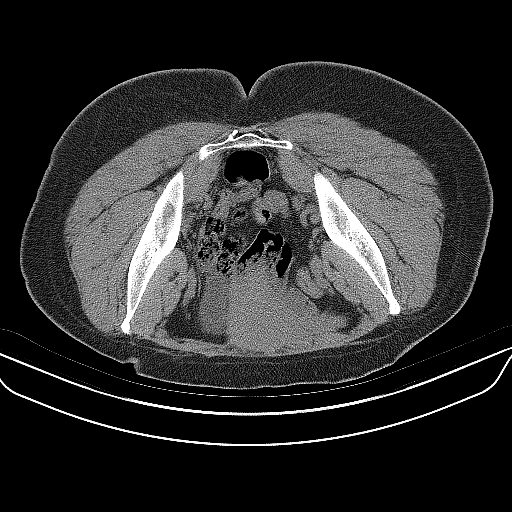
[im 6/12  soft-tissue]
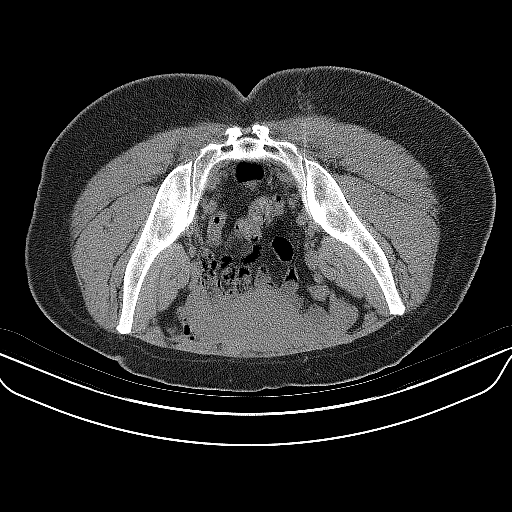
[im 9/12  soft-tissue]
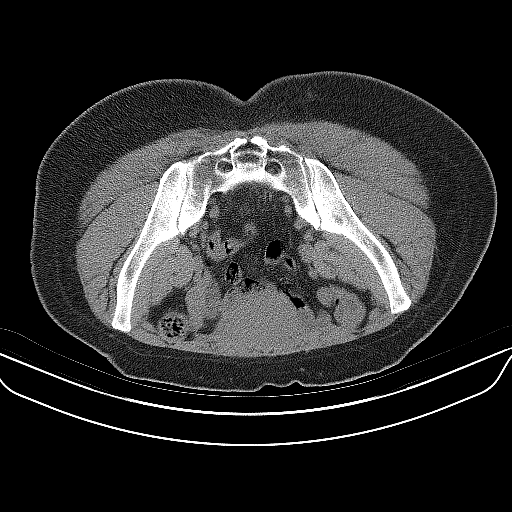

[12 of 32 positions shown; findings below may reference images not displayed]

EXAM:
CT GUIDED LEFT SI JOINT INJECTION

PROCEDURE:
After a thorough discussion of risks and benefits of the procedure,
including bleeding, infection, injury to nerves, blood vessels, and
adjacent structures, verbal and written consent was obtained. The
patient was placed prone on the CT table and localization was
performed over the sacrum. Target site marked using CT guidance. The
skin was prepped and draped in the usual sterile fashion using
Betadine soap.

After local anesthesia with 1% lidocaine without epinephrine and
subsequent deep anesthesia, a 3.5 inch 22 gauge spinal needle was
advanced into the left SI joint under intermittent CT guidance.

Once the needle was in satisfactory position, representative image
was captured with the needle demonstrated in the sacroiliac joint.
Subsequently, 120 mg of Depo-Medrol and 1 mL of 0.5% bupivacaine
were injected into the left SI joint. Needles removed and a sterile
dressing applied.

Needle entry through the joint capsule and medication injection
produced concordant pain. No complications were observed.
IMPRESSION: Successful CT-guided left SI joint injection.
